# Patient Record
Sex: Female | Born: 1955 | Race: Black or African American | Hispanic: No | Marital: Single | Smoking: Current every day smoker
Health system: Southern US, Community
[De-identification: ages and names within clinical notes are randomized; demographics above are authoritative.]

## PROBLEM LIST (undated history)

## (undated) DIAGNOSIS — J45909 Unspecified asthma, uncomplicated: Secondary | ICD-10-CM

## (undated) DIAGNOSIS — I10 Essential (primary) hypertension: Secondary | ICD-10-CM

## (undated) DIAGNOSIS — I671 Cerebral aneurysm, nonruptured: Secondary | ICD-10-CM

## (undated) DIAGNOSIS — M1990 Unspecified osteoarthritis, unspecified site: Secondary | ICD-10-CM

## (undated) DIAGNOSIS — E119 Type 2 diabetes mellitus without complications: Secondary | ICD-10-CM

## (undated) HISTORY — PX: ABDOMINAL HYSTERECTOMY: SHX81

---

## 2013-06-05 ENCOUNTER — Emergency Department (HOSPITAL_COMMUNITY)
Admission: EM | Admit: 2013-06-05 | Discharge: 2013-06-05 | Disposition: A | Discharge status: Home / Self Care | Payer: Medicaid - Out of State | Attending: Emergency Medicine | Admitting: Emergency Medicine

## 2013-06-05 ENCOUNTER — Encounter (HOSPITAL_COMMUNITY): Payer: Self-pay

## 2013-06-05 ENCOUNTER — Emergency Department (HOSPITAL_COMMUNITY): Payer: Medicaid - Out of State

## 2013-06-05 DIAGNOSIS — F17200 Nicotine dependence, unspecified, uncomplicated: Secondary | ICD-10-CM | POA: Insufficient documentation

## 2013-06-05 DIAGNOSIS — J45909 Unspecified asthma, uncomplicated: Secondary | ICD-10-CM | POA: Insufficient documentation

## 2013-06-05 DIAGNOSIS — E119 Type 2 diabetes mellitus without complications: Secondary | ICD-10-CM

## 2013-06-05 DIAGNOSIS — E1169 Type 2 diabetes mellitus with other specified complication: Secondary | ICD-10-CM | POA: Insufficient documentation

## 2013-06-05 DIAGNOSIS — Z791 Long term (current) use of non-steroidal anti-inflammatories (NSAID): Secondary | ICD-10-CM | POA: Insufficient documentation

## 2013-06-05 DIAGNOSIS — I10 Essential (primary) hypertension: Secondary | ICD-10-CM | POA: Insufficient documentation

## 2013-06-05 DIAGNOSIS — R5381 Other malaise: Secondary | ICD-10-CM | POA: Insufficient documentation

## 2013-06-05 DIAGNOSIS — R0789 Other chest pain: Secondary | ICD-10-CM | POA: Insufficient documentation

## 2013-06-05 DIAGNOSIS — R5383 Other fatigue: Secondary | ICD-10-CM | POA: Insufficient documentation

## 2013-06-05 DIAGNOSIS — Z9119 Patient's noncompliance with other medical treatment and regimen: Secondary | ICD-10-CM | POA: Insufficient documentation

## 2013-06-05 DIAGNOSIS — M129 Arthropathy, unspecified: Secondary | ICD-10-CM | POA: Insufficient documentation

## 2013-06-05 DIAGNOSIS — Z9114 Patient's other noncompliance with medication regimen: Secondary | ICD-10-CM

## 2013-06-05 DIAGNOSIS — Z79899 Other long term (current) drug therapy: Secondary | ICD-10-CM | POA: Insufficient documentation

## 2013-06-05 DIAGNOSIS — Z794 Long term (current) use of insulin: Secondary | ICD-10-CM | POA: Insufficient documentation

## 2013-06-05 DIAGNOSIS — Z91199 Patient's noncompliance with other medical treatment and regimen due to unspecified reason: Secondary | ICD-10-CM | POA: Insufficient documentation

## 2013-06-05 HISTORY — DX: Type 2 diabetes mellitus without complications: E11.9

## 2013-06-05 HISTORY — DX: Unspecified asthma, uncomplicated: J45.909

## 2013-06-05 HISTORY — DX: Unspecified osteoarthritis, unspecified site: M19.90

## 2013-06-05 HISTORY — DX: Essential (primary) hypertension: I10

## 2013-06-05 LAB — CBC WITH DIFFERENTIAL/PLATELET
Basophils Absolute: 0 10*3/uL (ref 0.0–0.1)
Basophils Relative: 1 % (ref 0–1)
HCT: 37.6 % (ref 36.0–46.0)
Hemoglobin: 12.8 g/dL (ref 12.0–15.0)
Lymphocytes Relative: 50 % — ABNORMAL HIGH (ref 12–46)
Monocytes Absolute: 0.4 10*3/uL (ref 0.1–1.0)
Neutro Abs: 1.3 10*3/uL — ABNORMAL LOW (ref 1.7–7.7)
Neutrophils Relative %: 33 % — ABNORMAL LOW (ref 43–77)
RDW: 12.5 % (ref 11.5–15.5)
WBC: 3.9 10*3/uL — ABNORMAL LOW (ref 4.0–10.5)

## 2013-06-05 LAB — GLUCOSE, CAPILLARY: Glucose-Capillary: 316 mg/dL — ABNORMAL HIGH (ref 70–99)

## 2013-06-05 LAB — URINALYSIS, ROUTINE W REFLEX MICROSCOPIC
Glucose, UA: >1000 mg/dL — AB
Ketones, ur: NEGATIVE mg/dL
Leukocytes, UA: NEGATIVE
Nitrite: NEGATIVE
Specific Gravity, Urine: 1.028 (ref 1.005–1.030)
pH: 5.5 (ref 5.0–8.0)

## 2013-06-05 LAB — URINE MICROSCOPIC-ADD ON

## 2013-06-05 LAB — BASIC METABOLIC PANEL
CO2: 24 mEq/L (ref 19–32)
Chloride: 102 mEq/L (ref 96–112)
Creatinine, Ser: 0.92 mg/dL (ref 0.50–1.10)
GFR calc Af Amer: 77 mL/min — ABNORMAL LOW (ref >90)
Potassium: 4.8 mEq/L (ref 3.5–5.1)

## 2013-06-05 LAB — POCT I-STAT TROPONIN I: Troponin i, poc: 0 ng/mL (ref 0.00–0.08)

## 2013-06-05 MED ORDER — LABETALOL HCL 100 MG PO TABS: 100.0000 mg | ORAL_TABLET | Freq: Two times a day (BID) | ORAL | Status: AC

## 2013-06-05 MED ORDER — METFORMIN HCL 500 MG PO TABS: 500.0000 mg | ORAL_TABLET | Freq: Two times a day (BID) | ORAL | Status: AC

## 2013-06-05 MED ORDER — DICLOFENAC SODIUM 75 MG PO TBEC: 75.0000 mg | DELAYED_RELEASE_TABLET | Freq: Two times a day (BID) | ORAL | Status: AC

## 2013-06-05 MED ORDER — SODIUM CHLORIDE 0.9 % IV BOLUS (SEPSIS)
1000.0000 mL | Freq: Once | INTRAVENOUS | Status: AC
  Administered 2013-06-05: 1000 mL via INTRAVENOUS

## 2013-06-05 MED ORDER — INSULIN GLARGINE 100 UNIT/ML ~~LOC~~ SOLN: 10.0000 [IU] | Freq: Two times a day (BID) | SUBCUTANEOUS | Status: AC

## 2013-06-05 MED ORDER — CLONIDINE HCL 0.1 MG PO TABS: 0.1000 mg | ORAL_TABLET | Freq: Two times a day (BID) | ORAL | Status: AC

## 2013-06-05 MED ORDER — HYDRALAZINE HCL 25 MG PO TABS: 25.0000 mg | ORAL_TABLET | Freq: Three times a day (TID) | ORAL | Status: AC

## 2013-06-05 MED ORDER — GABAPENTIN 300 MG PO CAPS: 300.0000 mg | ORAL_CAPSULE | Freq: Three times a day (TID) | ORAL | Status: AC

## 2013-06-05 NOTE — ED Notes (Signed)
RN at the bedside. Pt aware of urine specimen needed.

## 2013-06-05 NOTE — ED Notes (Signed)
Family at bedside. 

## 2013-06-05 NOTE — ED Notes (Addendum)
Pt presents with report of elevated blood sugars x 1 week.  Pt reports readings have been 300-400, last CBG was today: 396.  Pt reports compliance with medication is here from [place], [place].  Pt reports increased fatigue. Pt reports intermittent chest pain that pt describes as sharp pains that last for a few seconds.

## 2013-06-05 NOTE — ED Notes (Signed)
Returned from xray

## 2013-06-05 NOTE — ED Provider Notes (Signed)
History     CSN: [id number]  Arrival date & time 06/05/13  1052   First MD Initiated Contact with Patient 06/05/13 1149      No chief complaint on file.    HPI Pt presents with report of elevated blood sugars x 1 week. Pt reports readings have been 300-400, last CBG was today: 396. Pt reports noncompliance with medication is here from [place], [place]. Pt reports increased fatigue. Pt reports intermittent chest pain that pt describes as sharp pains that last for a few seconds.   Past Medical History  Diagnosis Date  . Diabetes mellitus without complication   . Hypertension   . Arthritis   . Asthma     Past Surgical History  Procedure Laterality Date  . Abdominal hysterectomy      History reviewed. No pertinent family history.  History  Substance Use Topics  . Smoking status: Current Every Day Smoker -- 0.50 packs/day  . Smokeless tobacco: Not on file  . Alcohol Use: No    OB History   Grav Para Term Preterm Abortions TAB SAB Ect Mult Living                  Review of Systems  All other systems reviewed and are negative.    Allergies  Review of patient's allergies indicates not on file.  Home Medications   Current Outpatient Rx  Name  Route  Sig  Dispense  Refill  . cloNIDine (CATAPRES) 0.1 MG tablet   Oral   Take 1 tablet (0.1 mg total) by mouth 2 (two) times daily.   60 tablet   0   . diclofenac (VOLTAREN) 75 MG EC tablet   Oral   Take 1 tablet (75 mg total) by mouth 2 (two) times daily.   60 tablet   0   . gabapentin (NEURONTIN) 300 MG capsule   Oral   Take 1 capsule (300 mg total) by mouth 3 (three) times daily.   90 capsule   0   . hydrALAZINE (APRESOLINE) 25 MG tablet   Oral   Take 1 tablet (25 mg total) by mouth 3 (three) times daily.   90 tablet   0   . insulin glargine (LANTUS) 100 UNIT/ML injection   Subcutaneous   Inject 0.1 mLs (10 Units total) into the skin 2 (two) times daily.   10 mL   1   . labetalol (NORMODYNE) 100 MG  tablet   Oral   Take 1 tablet (100 mg total) by mouth 2 (two) times daily.   60 tablet   0   . metFORMIN (GLUCOPHAGE) 500 MG tablet   Oral   Take 1 tablet (500 mg total) by mouth 2 (two) times daily with a meal.   60 tablet   0     BP 170/98  Pulse 88  Temp(Src) 98.5 F (36.9 C) (Oral)  Resp 21  SpO2 100%  Physical Exam  Nursing note and vitals reviewed. Constitutional: She is oriented to person, place, and time. She appears well-developed and well-nourished. No distress.  HENT:  Head: Normocephalic and atraumatic.  Eyes: Pupils are equal, round, and reactive to light.  Neck: Normal range of motion.  Cardiovascular: Normal rate and intact distal pulses.   Pulmonary/Chest: No respiratory distress.  Abdominal: Normal appearance. She exhibits no distension.  Musculoskeletal: Normal range of motion.  Neurological: She is alert and oriented to person, place, and time. No cranial nerve deficit.  Skin: Skin is warm and dry.  No rash noted.  Psychiatric: She has a normal mood and affect. Her behavior is normal.    ED Course  Procedures (including critical care time)  Labs Reviewed  CBC WITH DIFFERENTIAL - Abnormal; Notable for the following:    WBC 3.9 (*)    Neutrophils Relative % 33 (*)    Neutro Abs 1.3 (*)    Lymphocytes Relative 50 (*)    Eosinophils Relative 7 (*)    All other components within normal limits  BASIC METABOLIC PANEL - Abnormal; Notable for the following:    Glucose, Bld 304 (*)    GFR calc non Af Amer 67 (*)    GFR calc Af Amer 77 (*)    All other components within normal limits  URINALYSIS, ROUTINE W REFLEX MICROSCOPIC - Abnormal; Notable for the following:    Glucose, UA >1000 (*)    Protein, ur 30 (*)    All other components within normal limits  GLUCOSE, CAPILLARY - Abnormal; Notable for the following:    Glucose-Capillary 316 (*)    All other components within normal limits  URINE MICROSCOPIC-ADD ON - Abnormal; Notable for the following:     Squamous Epithelial / LPF FEW (*)    Bacteria, UA FEW (*)    All other components within normal limits  POCT I-STAT TROPONIN I   Dg Chest 2 View  06/05/2013   *RADIOLOGY REPORT*  Clinical Data: Chest pain.  Hyperglycemia.  Smoker.  CHEST - 2 VIEW  Comparison: None.  Findings: Borderline enlarged cardiac silhouette.  Moderately large hiatal hernia.  Clear lungs with normal vascularity.  Unremarkable bones.  Calcified loose body inferior to the right glenohumeral joint.  IMPRESSION:  1.  Borderline cardiomegaly. 2.  Moderately large hiatal hernia.   Original Report Authenticated By: Beckie Salts, M.D.     1. Diabetes   2. H/O medication noncompliance       MDM          Nelia Shi, MD 06/05/13 1542

## 2013-06-05 NOTE — ED Notes (Signed)
Case Management at bedside.

## 2013-06-05 NOTE — ED Notes (Signed)
Patient transported to X-ray 

## 2013-06-05 NOTE — ED Notes (Signed)
Daughter wanted to let RN/MD that she had arrived back to the room

## 2013-06-05 NOTE — ED Notes (Signed)
Pt states she had 1 episode of CP last night that lasted for 1 second. Pt reports pain was non radiating left sided pain. Describes the pain as 2/10 sharp.

## 2013-06-05 NOTE — Progress Notes (Signed)
ADDENDUM 06/05/2013 14:30  CARE MANAGEMENT ED NOTE 06/05/2013  Patient:  Ellen Hernandez, Ellen Hernandez   Account Number:  [id number]  Date Initiated:  06/05/2013  Documentation initiated by:  Michel Bickers  Subjective/Objective Assessment:     Subjective/Objective Assessment Detail:     Action/Plan:   Action/Plan Detail:   Anticipated DC Date:  06/05/2013     Status Recommendation to Physician:   Result of Recommendation:      DC Planning Services  CM consult  MATCH Program    Choice offered to / List presented to:            Status of service:  Completed, signed off  ED Comments:   ED Comments Detail:  ED CM CONSULTED BY DR BEATON. HE STATES, PATIENT NOT ABLE TO AFFORD MEDICATIONS.  Pt is here from [facility], and staying with daughter for a while.  SPOKE WITH PT, SHE STATES, SHE UNABLE TO AFFORD MEDICATIONS AT THIS TIME. DISCUSSED THE MATCH PROGRAM AND EXPLAINED IT IS A ONE TIME ASSISTANCE MED PROGRAM WITH A $3.00 CO-PAY PER PERSCRIPTION. PT STATES, SHE WILL BE ABLE TO AFFORD THE CO-PAY.  PT WAS ENROLLED INTO THE PROGRAM.  MATCH LETTER GIVEN.  PT INSTRUCTED TO FOLLOW UP WITH A  PCP FOR DISEASE MANAGEMENT.

## 2014-02-27 ENCOUNTER — Emergency Department (HOSPITAL_COMMUNITY): Payer: Medicaid Other

## 2014-02-27 ENCOUNTER — Encounter (HOSPITAL_COMMUNITY): Payer: Self-pay | Admitting: Emergency Medicine

## 2014-02-27 ENCOUNTER — Emergency Department (HOSPITAL_COMMUNITY)
Admission: EM | Admit: 2014-02-27 | Discharge: 2014-02-27 | Disposition: A | Discharge status: Home / Self Care | Payer: Medicaid Other | Attending: Emergency Medicine | Admitting: Emergency Medicine

## 2014-02-27 DIAGNOSIS — J45909 Unspecified asthma, uncomplicated: Secondary | ICD-10-CM | POA: Insufficient documentation

## 2014-02-27 DIAGNOSIS — Z791 Long term (current) use of non-steroidal anti-inflammatories (NSAID): Secondary | ICD-10-CM | POA: Insufficient documentation

## 2014-02-27 DIAGNOSIS — M129 Arthropathy, unspecified: Secondary | ICD-10-CM | POA: Insufficient documentation

## 2014-02-27 DIAGNOSIS — R739 Hyperglycemia, unspecified: Secondary | ICD-10-CM

## 2014-02-27 DIAGNOSIS — F17200 Nicotine dependence, unspecified, uncomplicated: Secondary | ICD-10-CM | POA: Insufficient documentation

## 2014-02-27 DIAGNOSIS — Z79899 Other long term (current) drug therapy: Secondary | ICD-10-CM | POA: Insufficient documentation

## 2014-02-27 DIAGNOSIS — Z794 Long term (current) use of insulin: Secondary | ICD-10-CM | POA: Insufficient documentation

## 2014-02-27 DIAGNOSIS — I10 Essential (primary) hypertension: Secondary | ICD-10-CM | POA: Insufficient documentation

## 2014-02-27 DIAGNOSIS — R51 Headache: Secondary | ICD-10-CM | POA: Insufficient documentation

## 2014-02-27 DIAGNOSIS — R519 Headache, unspecified: Secondary | ICD-10-CM

## 2014-02-27 DIAGNOSIS — E119 Type 2 diabetes mellitus without complications: Secondary | ICD-10-CM | POA: Insufficient documentation

## 2014-02-27 HISTORY — DX: Cerebral aneurysm, nonruptured: I67.1

## 2014-02-27 LAB — BASIC METABOLIC PANEL
BUN: 18 mg/dL (ref 6–23)
CO2: 24 mEq/L (ref 19–32)
Calcium: 9.6 mg/dL (ref 8.4–10.5)
Chloride: 99 mEq/L (ref 96–112)
Creatinine, Ser: 1.01 mg/dL (ref 0.50–1.10)
GFR calc Af Amer: 69 mL/min — ABNORMAL LOW (ref >90)
GFR, EST NON AFRICAN AMERICAN: 59 mL/min — AB (ref >90)
GLUCOSE: 282 mg/dL — AB (ref 70–99)
Potassium: 5.2 mEq/L (ref 3.7–5.3)
SODIUM: 136 meq/L — AB (ref 137–147)

## 2014-02-27 LAB — CBC WITH DIFFERENTIAL/PLATELET
Basophils Absolute: 0 10*3/uL (ref 0.0–0.1)
Basophils Relative: 1 % (ref 0–1)
EOS ABS: 0.3 10*3/uL (ref 0.0–0.7)
Eosinophils Relative: 9 % — ABNORMAL HIGH (ref 0–5)
HCT: 38.7 % (ref 36.0–46.0)
Hemoglobin: 12.6 g/dL (ref 12.0–15.0)
LYMPHS ABS: 1.5 10*3/uL (ref 0.7–4.0)
LYMPHS PCT: 45 % (ref 12–46)
MCH: 27.5 pg (ref 26.0–34.0)
MCHC: 32.6 g/dL (ref 30.0–36.0)
MCV: 84.5 fL (ref 78.0–100.0)
Monocytes Absolute: 0.3 10*3/uL (ref 0.1–1.0)
Monocytes Relative: 10 % (ref 3–12)
Neutro Abs: 1.1 10*3/uL — ABNORMAL LOW (ref 1.7–7.7)
Neutrophils Relative %: 35 % — ABNORMAL LOW (ref 43–77)
PLATELETS: 185 10*3/uL (ref 150–400)
RBC: 4.58 MIL/uL (ref 3.87–5.11)
RDW: 13.1 % (ref 11.5–15.5)
WBC: 3.2 10*3/uL — AB (ref 4.0–10.5)

## 2014-02-27 LAB — CBG MONITORING, ED
GLUCOSE-CAPILLARY: 350 mg/dL — AB (ref 70–99)
Glucose-Capillary: 231 mg/dL — ABNORMAL HIGH (ref 70–99)

## 2014-02-27 MED ORDER — INSULIN GLARGINE 100 UNIT/ML ~~LOC~~ SOLN: 10.0000 [IU] | Freq: Two times a day (BID) | SUBCUTANEOUS | Status: AC

## 2014-02-27 MED ORDER — CLONIDINE HCL 0.1 MG PO TABS
0.1000 mg | ORAL_TABLET | Freq: Once | ORAL | Status: AC
  Administered 2014-02-27: 0.1 mg via ORAL
  Filled 2014-02-27: qty 1

## 2014-02-27 MED ORDER — CLONIDINE HCL 0.1 MG PO TABS: 0.1000 mg | ORAL_TABLET | Freq: Two times a day (BID) | ORAL | Status: AC

## 2014-02-27 MED ORDER — HYDRALAZINE HCL 25 MG PO TABS: 25.0000 mg | ORAL_TABLET | Freq: Three times a day (TID) | ORAL | Status: AC

## 2014-02-27 MED ORDER — SODIUM CHLORIDE 0.9 % IV BOLUS (SEPSIS)
1000.0000 mL | Freq: Once | INTRAVENOUS | Status: AC
  Administered 2014-02-27: 1000 mL via INTRAVENOUS

## 2014-02-27 MED ORDER — METFORMIN HCL 500 MG PO TABS: 500.0000 mg | ORAL_TABLET | Freq: Two times a day (BID) | ORAL | Status: AC

## 2014-02-27 NOTE — Discharge Instructions (Signed)
Hyperglycemia °Hyperglycemia occurs when the glucose (sugar) in your blood is too high. Hyperglycemia can happen for many reasons, but it most often happens to people who do not know they have diabetes or are not managing their diabetes properly.  °CAUSES  °Whether you have diabetes or not, there are other causes of hyperglycemia. Hyperglycemia can occur when you have diabetes, but it can also occur in other situations that you might not be as aware of, such as: °Diabetes °· If you have diabetes and are having problems controlling your blood glucose, hyperglycemia could occur because of some of the following reasons: °· Not following your meal plan. °· Not taking your diabetes medications or not taking it properly. °· Exercising less or doing less activity than you normally do. °· Being sick. °Pre-diabetes °· This cannot be ignored. Before people develop Type 2 diabetes, they almost always have "pre-diabetes." This is when your blood glucose levels are higher than normal, but not yet high enough to be diagnosed as diabetes. Research has shown that some long-term damage to the body, especially the heart and circulatory system, may already be occurring during pre-diabetes. If you take action to manage your blood glucose when you have pre-diabetes, you may delay or prevent Type 2 diabetes from developing. °Stress °· If you have diabetes, you may be "diet" controlled or on oral medications or insulin to control your diabetes. However, you may find that your blood glucose is higher than usual in the hospital whether you have diabetes or not. This is often referred to as "stress hyperglycemia." Stress can elevate your blood glucose. This happens because of hormones put out by the body during times of stress. If stress has been the cause of your high blood glucose, it can be followed regularly by your caregiver. That way he/she can make sure your hyperglycemia does not continue to get worse or progress to  diabetes. °Steroids °· Steroids are medications that act on the infection fighting system (immune system) to block inflammation or infection. One side effect can be a rise in blood glucose. Most people can produce enough extra insulin to allow for this rise, but for those who cannot, steroids make blood glucose levels go even higher. It is not unusual for steroid treatments to "uncover" diabetes that is developing. It is not always possible to determine if the hyperglycemia will go away after the steroids are stopped. A special blood test called an A1c is sometimes done to determine if your blood glucose was elevated before the steroids were started. °SYMPTOMS °· Thirsty. °· Frequent urination. °· Dry mouth. °· Blurred vision. °· Tired or fatigue. °· Weakness. °· Sleepy. °· Tingling in feet or leg. °DIAGNOSIS  °Diagnosis is made by monitoring blood glucose in one or all of the following ways: °· A1c test. This is a chemical found in your blood. °· Fingerstick blood glucose monitoring. °· Laboratory results. °TREATMENT  °First, knowing the cause of the hyperglycemia is important before the hyperglycemia can be treated. Treatment may include, but is not be limited to: °· Education. °· Change or adjustment in medications. °· Change or adjustment in meal plan. °· Treatment for an illness, infection, etc. °· More frequent blood glucose monitoring. °· Change in exercise plan. °· Decreasing or stopping steroids. °· Lifestyle changes. °HOME CARE INSTRUCTIONS  °· Test your blood glucose as directed. °· Exercise regularly. Your caregiver will give you instructions about exercise. Pre-diabetes or diabetes which comes on with stress is helped by exercising. °· Eat wholesome,   balanced meals. Eat often and at regular, fixed times. Your caregiver or nutritionist will give you a meal plan to guide your sugar intake. °· Being at an ideal weight is important. If needed, losing as little as 10 to 15 pounds may help improve blood  glucose levels. °SEEK MEDICAL CARE IF:  °· You have questions about medicine, activity, or diet. °· You continue to have symptoms (problems such as increased thirst, urination, or weight gain). °SEEK IMMEDIATE MEDICAL CARE IF:  °· You are vomiting or have diarrhea. °· Your breath smells fruity. °· You are breathing faster or slower. °· You are very sleepy or incoherent. °· You have numbness, tingling, or pain in your feet or hands. °· You have chest pain. °· Your symptoms get worse even though you have been following your caregiver's orders. °· If you have any other questions or concerns. °Document Released: 06/06/2001 Document Revised: 03/04/2012 Document Reviewed: 04/08/2012 °ExitCare® Patient Information ©2014 [employer]. °Hypertension °As your heart beats, it forces blood through your arteries. This force is your blood pressure. If the pressure is too high, it is called hypertension (HTN) or high blood pressure. HTN is dangerous because you may have it and not know it. High blood pressure may mean that your heart has to work harder to pump blood. Your arteries may be narrow or stiff. The extra work puts you at risk for heart disease, stroke, and other problems.  °Blood pressure consists of two numbers, a higher number over a lower, 110/72, for example. It is stated as "110 over 72." The ideal is below 120 for the top number (systolic) and under 80 for the bottom (diastolic). Write down your blood pressure today. °You should pay close attention to your blood pressure if you have certain conditions such as: °· Heart failure. °· Prior heart attack. °· Diabetes °· Chronic kidney disease. °· Prior stroke. °· Multiple risk factors for heart disease. °To see if you have HTN, your blood pressure should be measured while you are seated with your arm held at the level of the heart. It should be measured at least twice. A one-time elevated blood pressure reading (especially in the Emergency Department) does not mean  that you need treatment. There may be conditions in which the blood pressure is different between your right and left arms. It is important to see your caregiver soon for a recheck. °Most people have essential hypertension which means that there is not a specific cause. This type of high blood pressure may be lowered by changing lifestyle factors such as: °· Stress. °· Smoking. °· Lack of exercise. °· Excessive weight. °· Drug/tobacco/alcohol use. °· Eating less salt. °Most people do not have symptoms from high blood pressure until it has caused damage to the body. Effective treatment can often prevent, delay or reduce that damage. °TREATMENT  °When a cause has been identified, treatment for high blood pressure is directed at the cause. There are a large number of medications to treat HTN. These fall into several categories, and your caregiver will help you select the medicines that are best for you. Medications may have side effects. You should review side effects with your caregiver. °If your blood pressure stays high after you have made lifestyle changes or started on medicines,  °· Your medication(s) may need to be changed. °· Other problems may need to be addressed. °· Be certain you understand your prescriptions, and know how and when to take your medicine. °· Be sure to follow up   with your caregiver within the time frame advised (usually within two weeks) to have your blood pressure rechecked and to review your medications. °· If you are taking more than one medicine to lower your blood pressure, make sure you know how and at what times they should be taken. Taking two medicines at the same time can result in blood pressure that is too low. °SEEK IMMEDIATE MEDICAL CARE IF: °· You develop a severe headache, blurred or changing vision, or confusion. °· You have unusual weakness or numbness, or a faint feeling. °· You have severe chest or abdominal pain, vomiting, or breathing problems. °MAKE SURE YOU:   °· Understand these instructions. °· Will watch your condition. °· Will get help right away if you are not doing well or get worse. °Document Released: 12/11/2005 Document Revised: 03/04/2012 Document Reviewed: 07/31/2008 °ExitCare® Patient Information ©2014 [employer]. ° °

## 2014-02-27 NOTE — ED Notes (Signed)
Pt c/o posterior headache x 2 days.  Pain score 2/10.  Hx of hypertension and reports that she hasn't taken her HTN medication for the "past few days,"  because she has been "running low."  Hx of DM, but has not been checking blood glucose.  Hx of anterior brain aneurysm x 2 years ago, but has not followed up w/ a neurologist.

## 2014-02-27 NOTE — ED Notes (Signed)
Pt presents with c/o headache; pt says she has diabetes and hypertension and is running low on both of her medications; pt is also here to have her blood sugar lowered.

## 2014-02-27 NOTE — ED Provider Notes (Signed)
CSN: [id number]     Arrival date & time 02/27/14  0957 History   First MD Initiated Contact with Patient 02/27/14 1110     Chief Complaint  Patient presents with  . Headache  . Hypertension  . Hyperglycemia     (Consider location/radiation/quality/duration/timing/severity/associated sxs/prior Treatment) HPI  This is a 58 year old female with history of diabetes, hypertension, and brain aneurysm who presents with hyperglycemia and hypertension. Patient also endorses headache. Patient reports that she has run out of her blood pressure and diabetes medications. She's not taking them for the last 2 days. She normally takes insulin and metformin blood sugar and takes clonidine 0.1 mg twice a day for blood pressure. She states that she feels generally fatigued and feels "like a sugar is high." Patient also reports 2 days of right sided occipital headache. She reports that is throbbing. Pain is currently 10/10. She denies worst headache of her life. She states that this is not similar to her headache when she had an aneurysm. She denies any focal complaints including weakness, numbness, tingling, or speech difficulty. She denies any vision changes.  Past Medical History  Diagnosis Date  . Diabetes mellitus without complication   . Hypertension   . Arthritis   . Asthma   . Brain aneurysm    Past Surgical History  Procedure Laterality Date  . Abdominal hysterectomy     History reviewed. No pertinent family history. History  Substance Use Topics  . Smoking status: Current Every Day Smoker -- 0.25 packs/day  . Smokeless tobacco: Not on file  . Alcohol Use: No   OB History   Grav Para Term Preterm Abortions TAB SAB Ect Mult Living                 Review of Systems  Constitutional: Positive for fatigue. Negative for fever.  Respiratory: Negative for cough, chest tightness and shortness of breath.   Cardiovascular: Negative for chest pain.  Gastrointestinal: Negative for nausea, vomiting  and abdominal pain.  Genitourinary: Negative for dysuria.  Musculoskeletal: Negative for back pain, neck pain and neck stiffness.  Skin: Negative for rash.  Neurological: Negative for dizziness, facial asymmetry, speech difficulty, weakness, numbness and headaches.  Psychiatric/Behavioral: Negative for confusion.  All other systems reviewed and are negative.      Allergies  Review of patient's allergies indicates no known allergies.  Home Medications   Current Outpatient Rx  Name  Route  Sig  Dispense  Refill  . cloNIDine (CATAPRES) 0.1 MG tablet   Oral   Take 1 tablet (0.1 mg total) by mouth 2 (two) times daily.   60 tablet   0   . diclofenac (VOLTAREN) 75 MG EC tablet   Oral   Take 1 tablet (75 mg total) by mouth 2 (two) times daily.   60 tablet   0   . gabapentin (NEURONTIN) 300 MG capsule   Oral   Take 1 capsule (300 mg total) by mouth 3 (three) times daily.   90 capsule   0   . hydrALAZINE (APRESOLINE) 25 MG tablet   Oral   Take 1 tablet (25 mg total) by mouth 3 (three) times daily.   90 tablet   0   . insulin glargine (LANTUS) 100 UNIT/ML injection   Subcutaneous   Inject 0.1 mLs (10 Units total) into the skin 2 (two) times daily.   10 mL   1   . labetalol (NORMODYNE) 100 MG tablet   Oral   Take 1  tablet (100 mg total) by mouth 2 (two) times daily.   60 tablet   0   . metFORMIN (GLUCOPHAGE) 500 MG tablet   Oral   Take 1 tablet (500 mg total) by mouth 2 (two) times daily with a meal.   60 tablet   0   . cloNIDine (CATAPRES) 0.1 MG tablet   Oral   Take 1 tablet (0.1 mg total) by mouth 2 (two) times daily.   60 tablet   11   . hydrALAZINE (APRESOLINE) 25 MG tablet   Oral   Take 1 tablet (25 mg total) by mouth 3 (three) times daily.   90 tablet   0   . insulin glargine (LANTUS) 100 UNIT/ML injection   Subcutaneous   Inject 0.1 mLs (10 Units total) into the skin 2 (two) times daily.   10 mL   5   . metFORMIN (GLUCOPHAGE) 500 MG tablet    Oral   Take 1 tablet (500 mg total) by mouth 2 (two) times daily with a meal.   60 tablet   0    BP 152/90  Pulse 78  Temp(Src) 98.2 F (36.8 C) (Oral)  Resp 15  SpO2 98% Physical Exam  Nursing note and vitals reviewed. Constitutional: She is oriented to person, place, and time. She appears well-developed and well-nourished. No distress.  HENT:  Head: Normocephalic and atraumatic.  Mouth/Throat: Oropharynx is clear and moist.  Tenderness to palpation over the insertion of the trapezius musculature into the occiput  Eyes: EOM are normal. Pupils are equal, round, and reactive to light.  Neck: Neck supple.  Cardiovascular: Normal rate, regular rhythm and normal heart sounds.   Pulmonary/Chest: Effort normal. No respiratory distress. She has no wheezes.  Abdominal: Soft. Bowel sounds are normal. There is no tenderness. There is no rebound.  Neurological: She is alert and oriented to person, place, and time. No cranial nerve deficit. Coordination normal.  5 out of 5 strength in all 4 extremities  Skin: Skin is warm and dry.  Psychiatric: She has a normal mood and affect.    ED Course  Procedures (including critical care time) Labs Review Labs Reviewed  CBC WITH DIFFERENTIAL - Abnormal; Notable for the following:    WBC 3.2 (*)    Neutrophils Relative % 35 (*)    Neutro Abs 1.1 (*)    Eosinophils Relative 9 (*)    All other components within normal limits  BASIC METABOLIC PANEL - Abnormal; Notable for the following:    Sodium 136 (*)    Glucose, Bld 282 (*)    GFR calc non Af Amer 59 (*)    GFR calc Af Amer 69 (*)    All other components within normal limits  CBG MONITORING, ED - Abnormal; Notable for the following:    Glucose-Capillary 350 (*)    All other components within normal limits  CBG MONITORING, ED - Abnormal; Notable for the following:    Glucose-Capillary 231 (*)    All other components within normal limits   Imaging Review Ct Head Wo Contrast  02/27/2014    CLINICAL DATA:  Headache with hypertension  EXAM: CT HEAD WITHOUT CONTRAST  TECHNIQUE: Contiguous axial images were obtained from the base of the skull through the vertex without intravenous contrast. Study was obtained within 24 hr of patient's arrival at the emergency department.  COMPARISON:  None.  FINDINGS: There is mild generalized atrophy. There is no mass, hemorrhage, extra-axial fluid collection, or midline shift. There  is patchy small vessel disease in the centra semiovale bilaterally. There is no acute appearing infarct.  Bony calvarium appears intact. The mastoid air cells are clear. There is mild debris in the right external auditory canal.  IMPRESSION: Atrophy with periventricular small vessel disease. No intracranial mass, hemorrhage, or acute appearing infarct. Probable cerumen in the right external auditory canal.   Electronically Signed   By: Bretta Bang M.D.   On: 02/27/2014 11:56     EKG Interpretation   Date/Time:  Friday February 27 2014 11:39:04 EST Ventricular Rate:  91 PR Interval:  123 QRS Duration: 67 QT Interval:  370 QTC Calculation: 455 R Axis:   7 Text Interpretation:  Sinus rhythm Nonspecific T abnormalities, lateral  leads Minimal ST elevation, anterior leads No significant change since  last tracing Confirmed by Aerial Dilley  MD, Toni Amend ([id number]) on 02/27/2014 7:32:49  PM      MDM   Final diagnoses:  Hypertension  Hyperglycemia  Headache    Patient with history of diabetes and hypertension presents with hyperglycemia and high blood pressure. His other medications. Has not established primary care. Also reports headache. Exam is nonfocal. Vital signs notable for blood pressure 176/105. Patient given oral clonidine. Basic labwork was obtained and shows hyperglycemia without evidence of gap. She was hydrated. Patient reports improvement of headache. CT scan of the head shows no evidence of blood or hypertensive changes. Given duration of headache and description  of headache, have low suspicion of SAH at this time.  No signs or symptoms of hypertensive urgency or emergency.  Patient will be given a short course of her home blood pressure and diabetes medications.  Patient was given referral to primary care.  After history, exam, and medical workup I feel the patient has been appropriately medically screened and is safe for discharge home. Pertinent diagnoses were discussed with the patient. Patient was given return precautions.     Shon Baton, MD 02/28/14 [phone]

## 2014-04-27 IMAGING — CR DG CHEST 2V
2 series · 2 of 2 positions shown · non-contrast
Comparison: None.

CLINICAL DATA: Chest pain.  Hyperglycemia.  Smoker.

CHEST - 2 VIEW

[w chest pa]
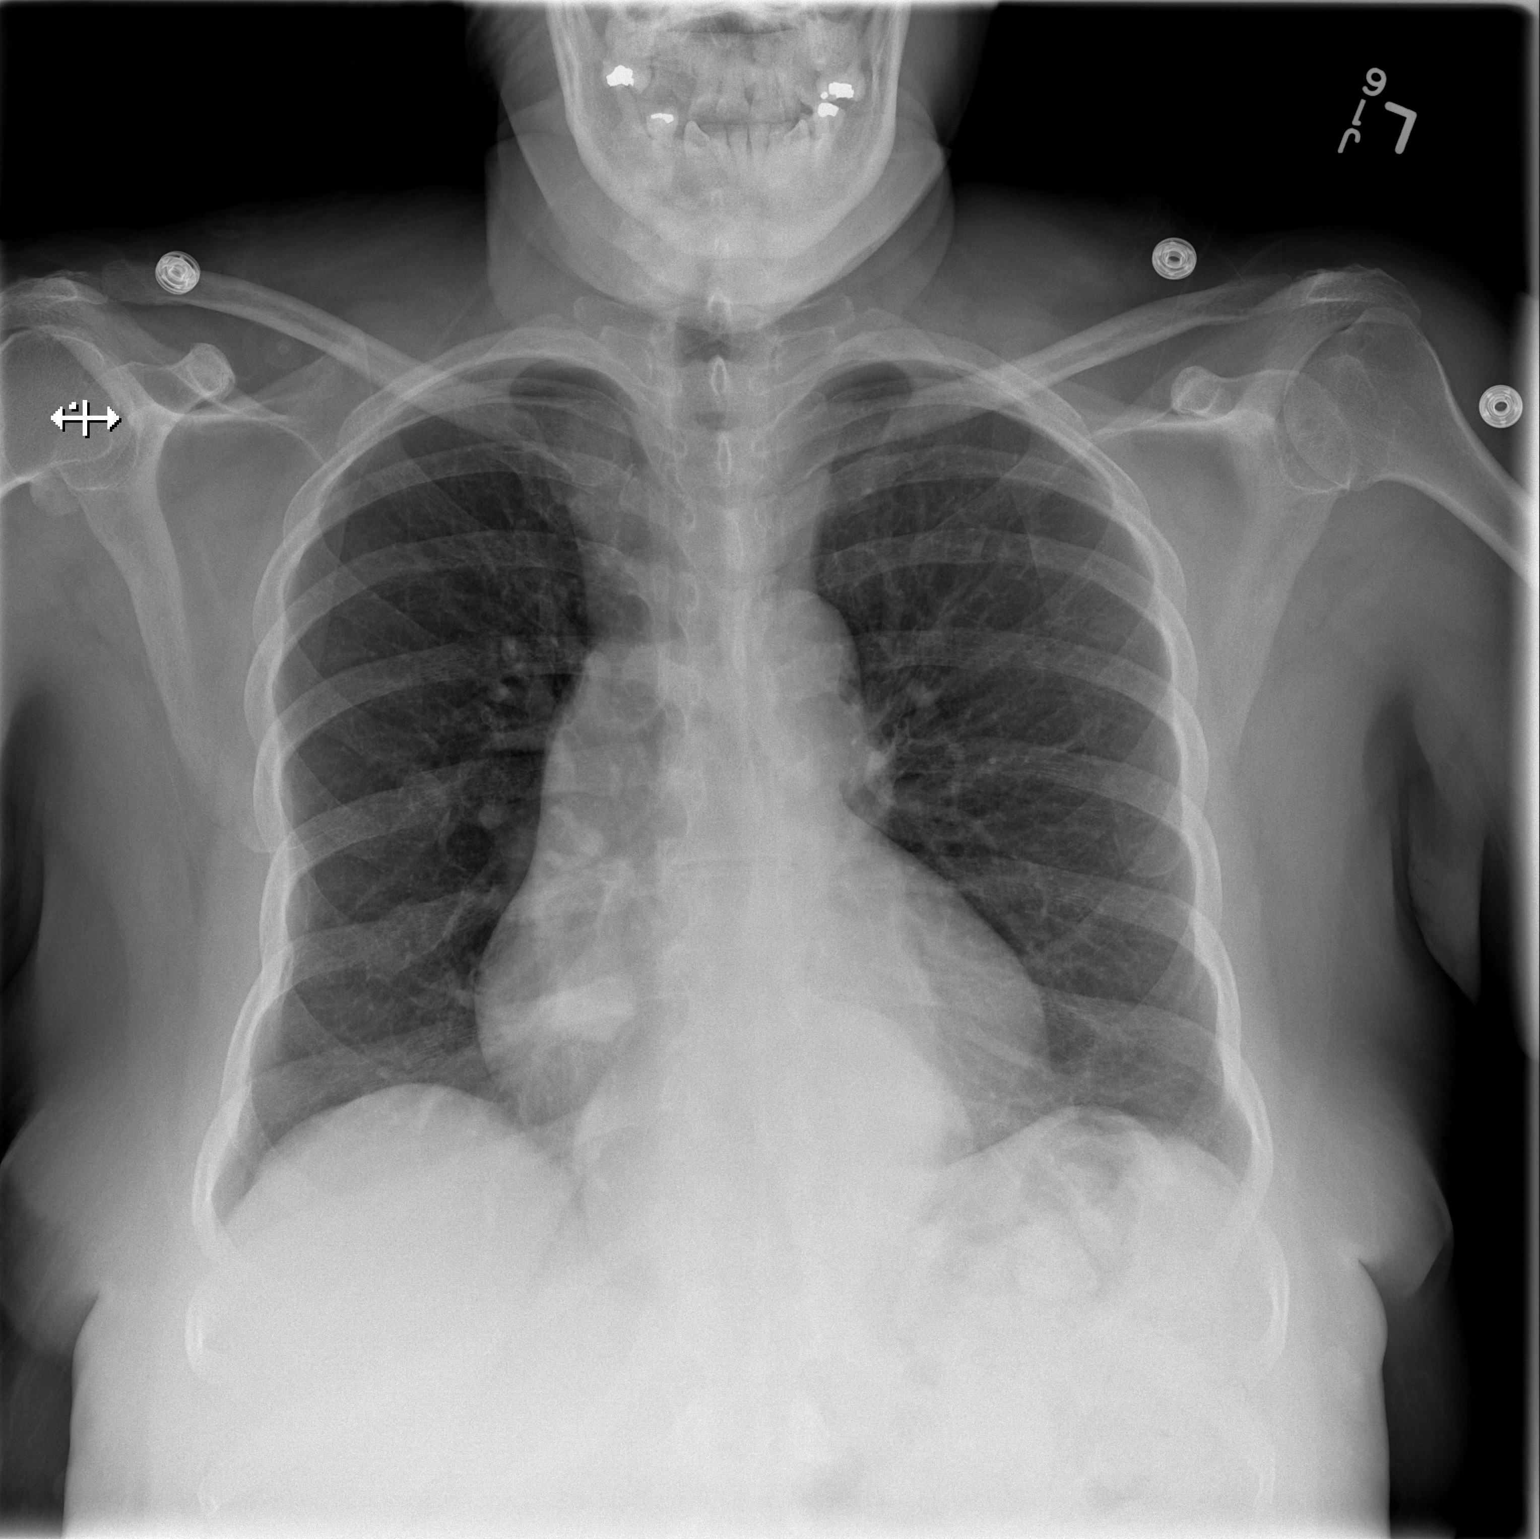

[w chest lat]
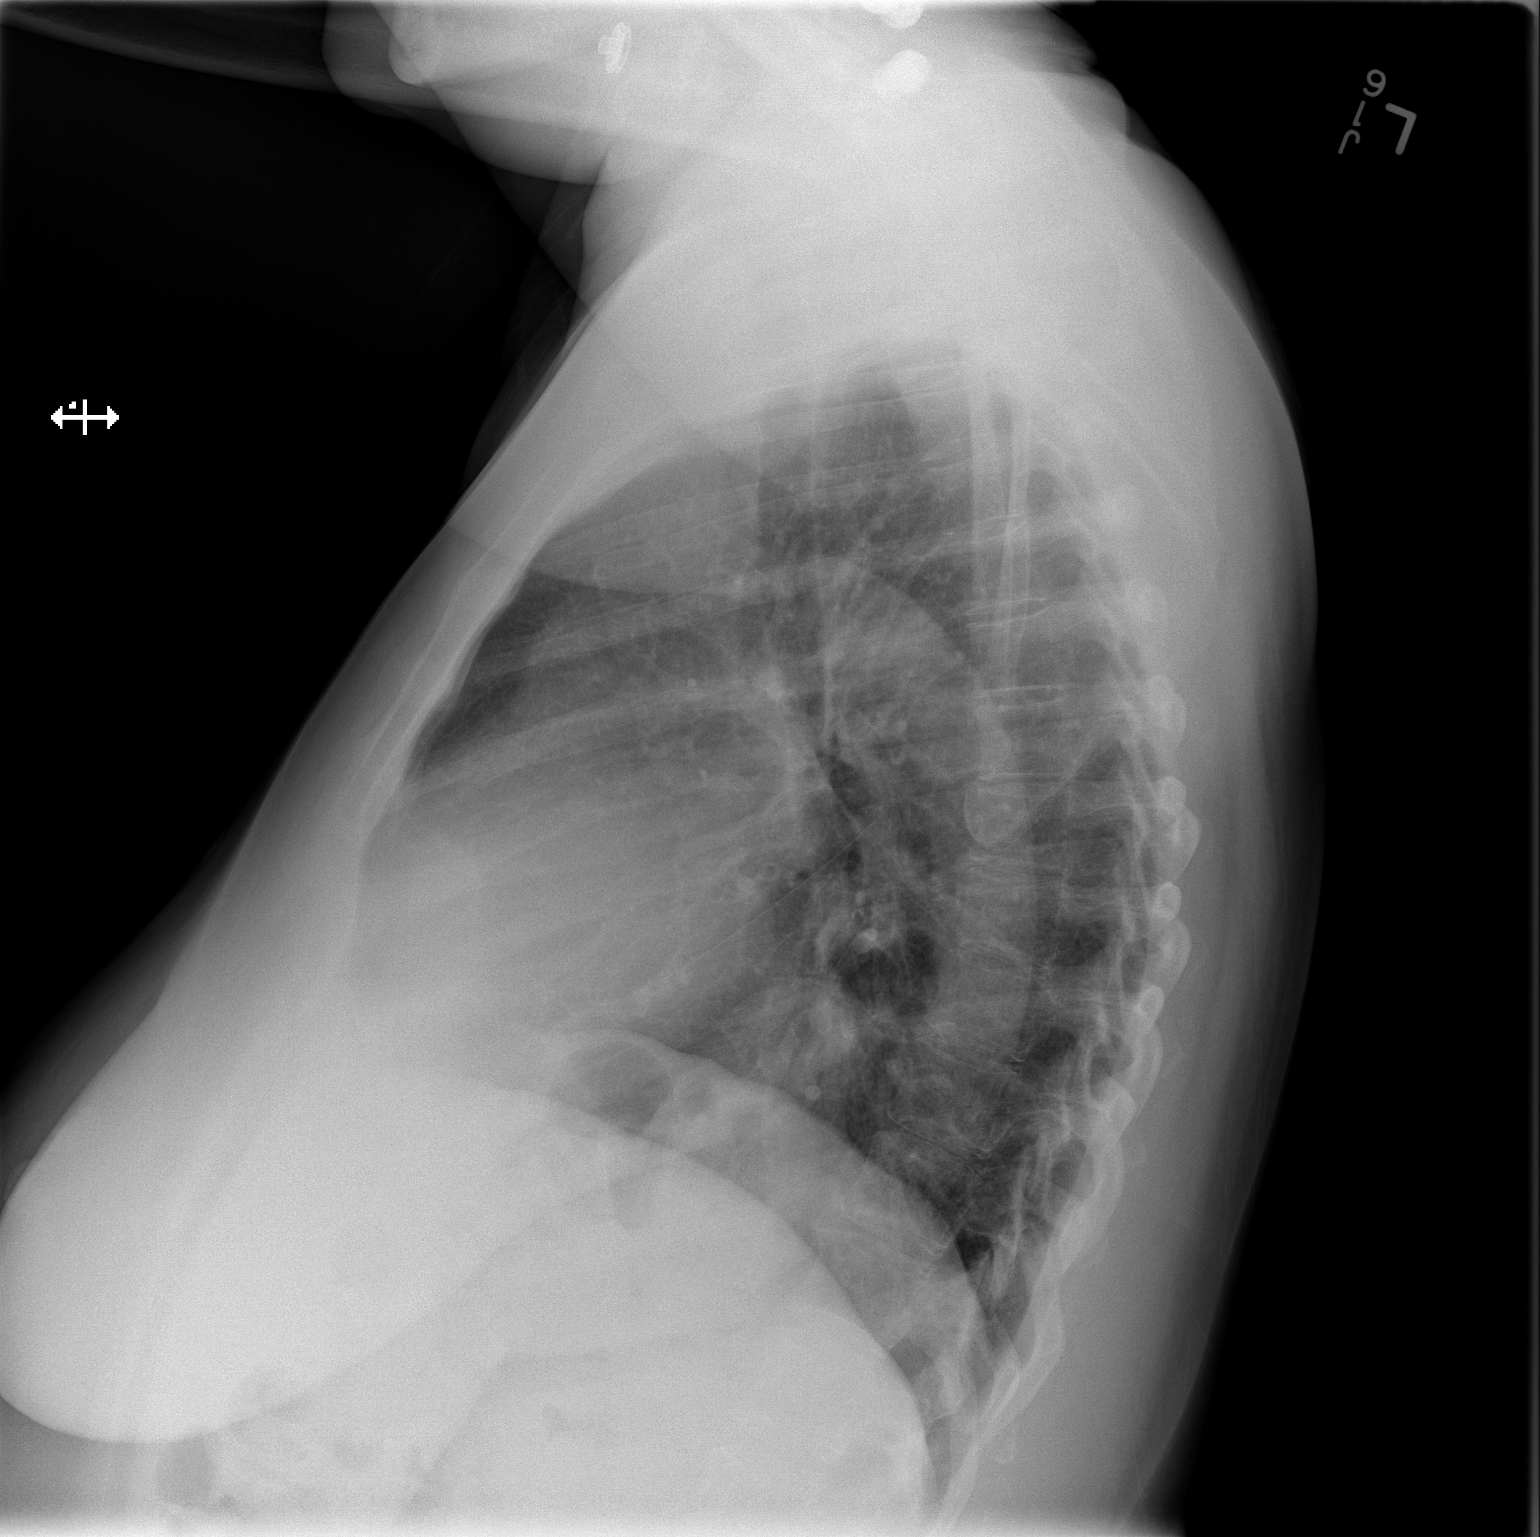

[2 of 2 positions shown; findings below may reference images not displayed]

FINDINGS: Borderline enlarged cardiac silhouette.  Moderately large
hiatal hernia.  Clear lungs with normal vascularity.  Unremarkable
bones.  Calcified loose body inferior to the right glenohumeral
joint.
IMPRESSION: 1.  Borderline cardiomegaly.
2.  Moderately large hiatal hernia.

## 2015-01-19 IMAGING — CT CT HEAD W/O CM
2 series · 17 of 30 positions shown, 20 images · non-contrast
Comparison: None.

CLINICAL DATA: Headache with hypertension

EXAM:
CT HEAD WITHOUT CONTRAST
TECHNIQUE: Contiguous axial images were obtained from the base of the skull
through the vertex without intravenous contrast. Study was obtained
within 24 hr of patient's arrival at the emergency department.

[Series 2: bone windows · axial · 0.48mm/px · z∈[-314,-185]mm · 8 of 57 slices shown]
[im 7/57  bone]
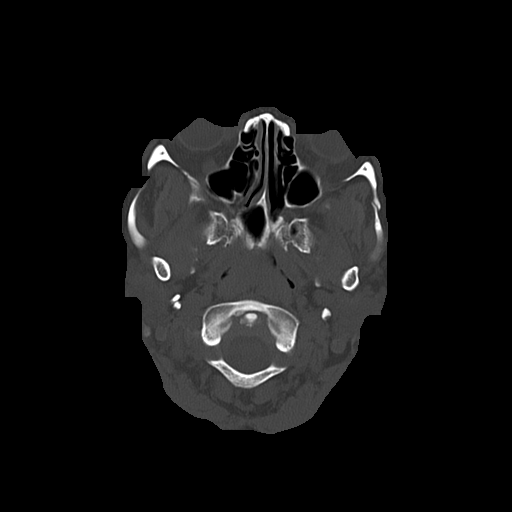
[im 13/57  bone]
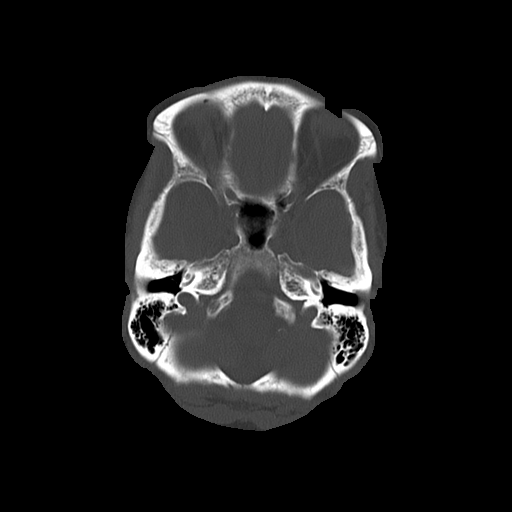
[im 19/57  bone]
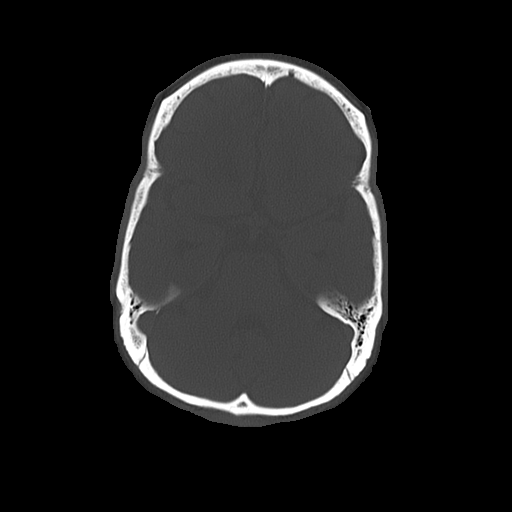
[im 25/57  bone]
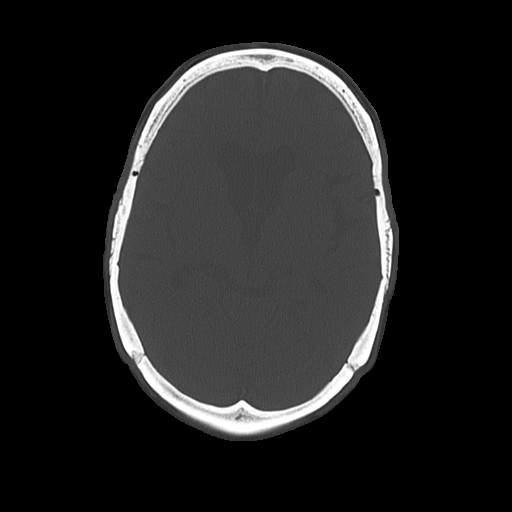
[im 32/57  bone]
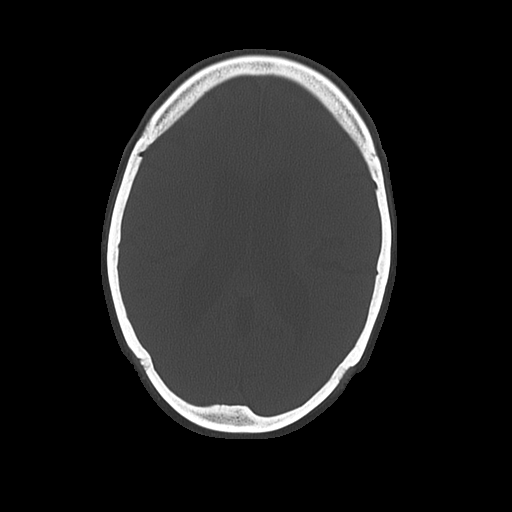
[im 38/57  bone]
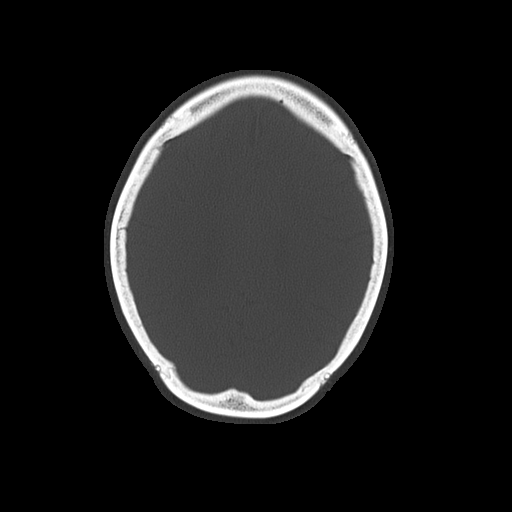
[im 44/57  bone]
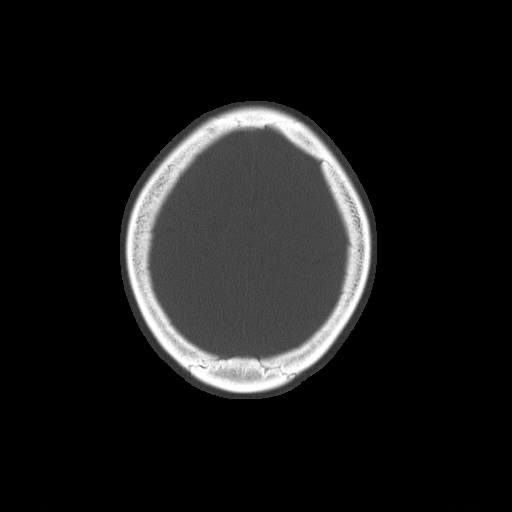
[im 50/57  bone]
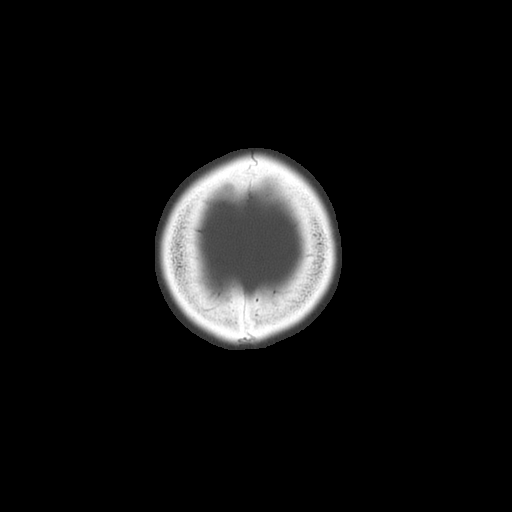

[Series 3: head w/o · axial · non-contrast · 0.48mm/px · z∈[-317,-187]mm · 9 of 34 slices shown, 12 images]
[im 4/34  brain]
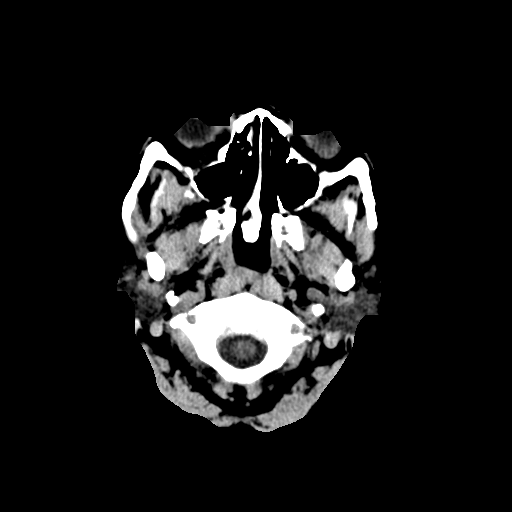
[im 4/34  bone]
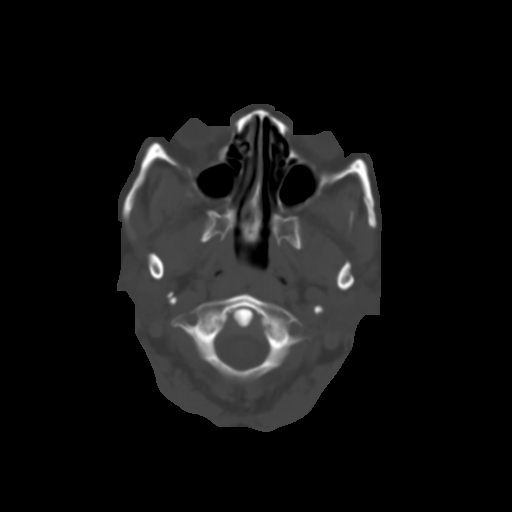
[im 7/34  brain]
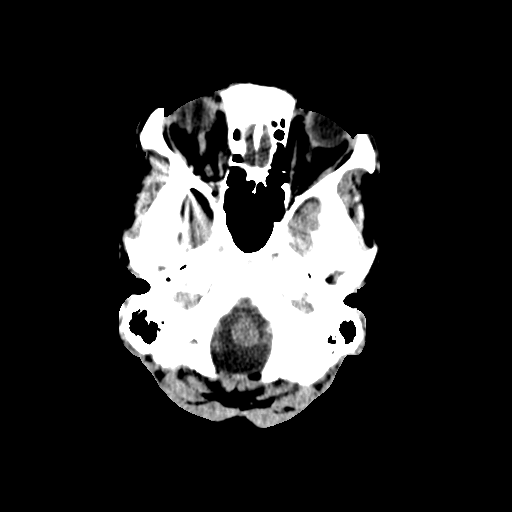
[im 10/34  brain]
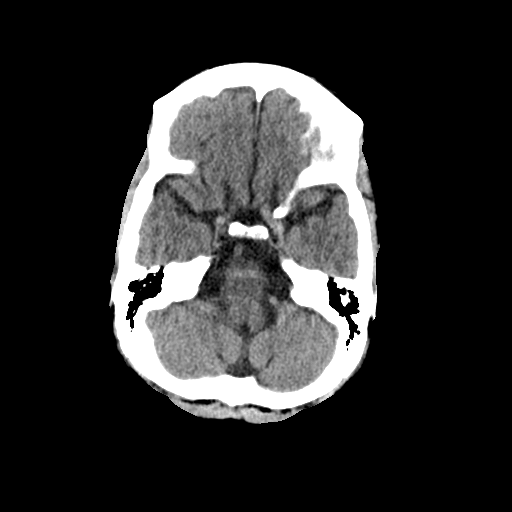
[im 14/34  brain]
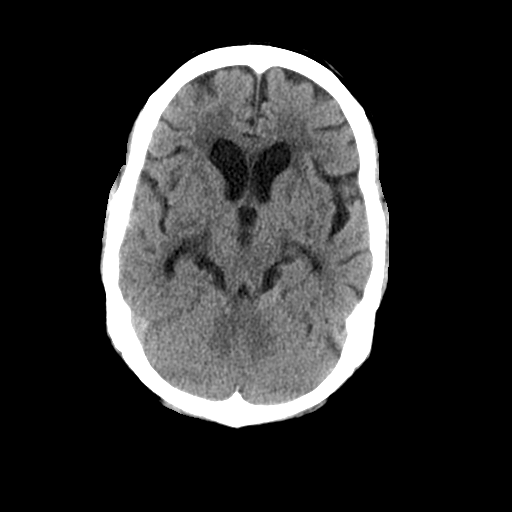
[im 17/34  brain]
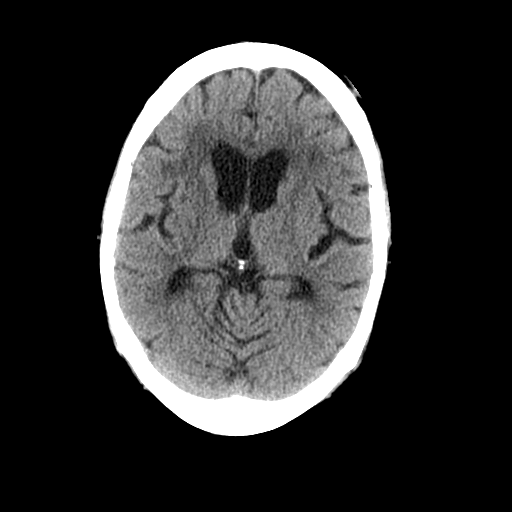
[im 17/34  bone]
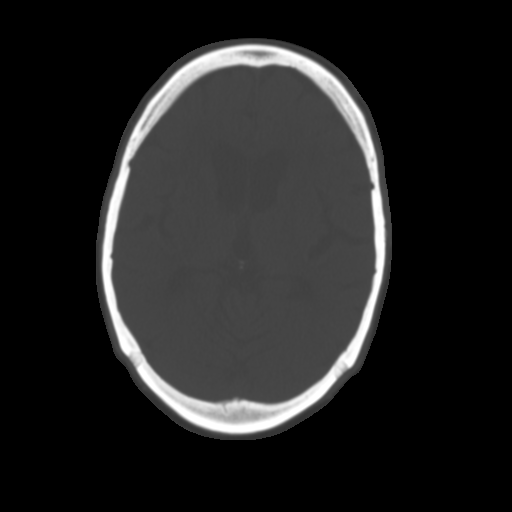
[im 20/34  brain]
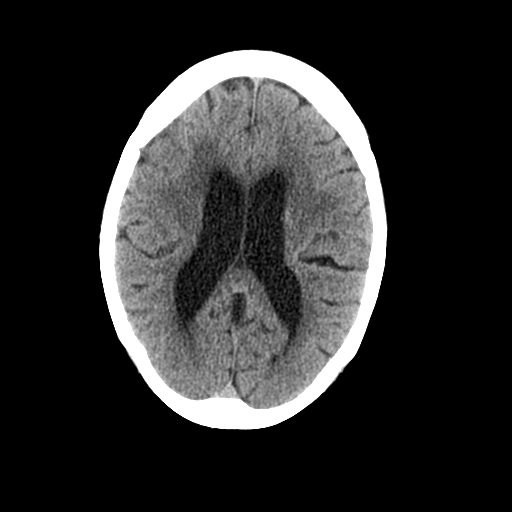
[im 24/34  brain]
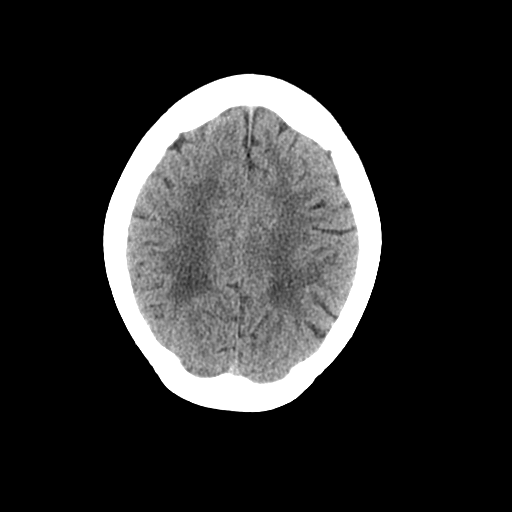
[im 27/34  brain]
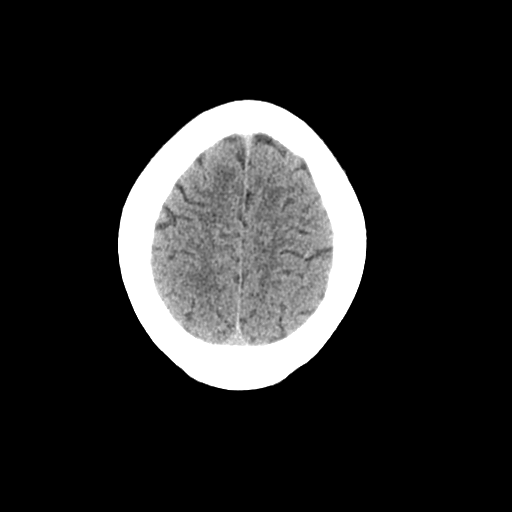
[im 30/34  brain]
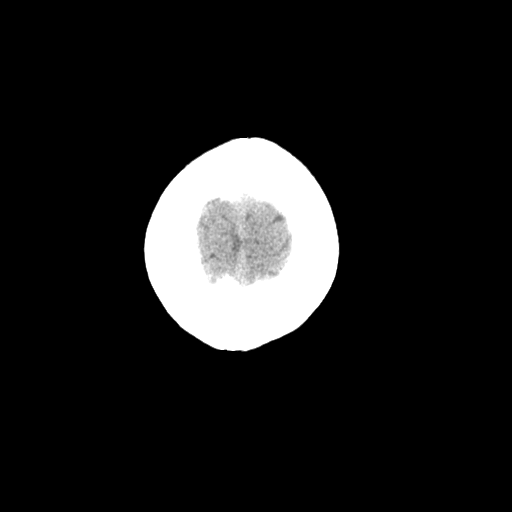
[im 30/34  bone]
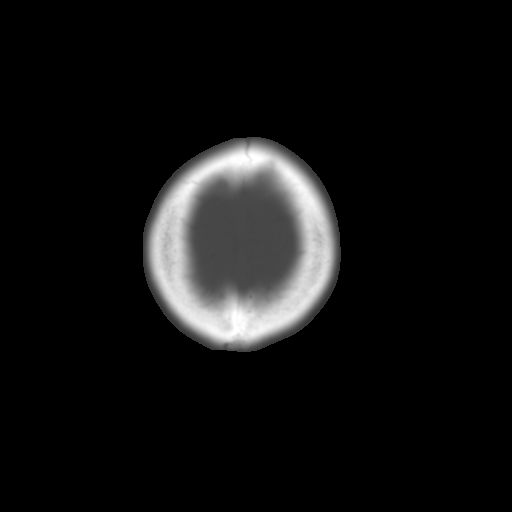

[17 of 30 positions shown; findings below may reference images not displayed]

FINDINGS: There is mild generalized atrophy. There is no mass, hemorrhage,
extra-axial fluid collection, or midline shift. There is patchy
small vessel disease in the centra semiovale bilaterally. There is
no acute appearing infarct.

Bony calvarium appears intact. The mastoid air cells are clear.
There is mild debris in the right external auditory canal.
IMPRESSION: Atrophy with periventricular small vessel disease. No intracranial
mass, hemorrhage, or acute appearing infarct. Probable cerumen in
the right external auditory canal.

## 2021-12-25 DEATH — deceased
